# Patient Record
Sex: Male | Born: 1950
Health system: Southern US, Community
[De-identification: ages and names within clinical notes are randomized; demographics above are authoritative.]

## PROBLEM LIST (undated history)

## (undated) DIAGNOSIS — M1990 Unspecified osteoarthritis, unspecified site: Secondary | ICD-10-CM

## (undated) HISTORY — PX: ESOPHAGOGASTRODUODENOSCOPY ENDOSCOPY: SHX5814

## (undated) HISTORY — PX: BLADDER SURGERY: SHX569

## (undated) HISTORY — PX: BACK SURGERY: SHX140

## (undated) HISTORY — PX: OTHER SURGICAL HISTORY: SHX169

---

## 1998-05-09 ENCOUNTER — Ambulatory Visit (HOSPITAL_COMMUNITY): Admission: RE | Admit: 1998-05-09 | Discharge: 1998-05-09 | Payer: Self-pay | Admitting: Family Medicine

## 1998-09-25 ENCOUNTER — Ambulatory Visit (HOSPITAL_COMMUNITY): Admission: RE | Admit: 1998-09-25 | Discharge: 1998-09-25 | Payer: Self-pay | Admitting: Internal Medicine

## 1998-09-25 ENCOUNTER — Encounter: Payer: Self-pay | Admitting: Internal Medicine

## 1998-11-04 ENCOUNTER — Other Ambulatory Visit: Admission: RE | Admit: 1998-11-04 | Discharge: 1998-11-04 | Payer: Self-pay | Admitting: Gastroenterology

## 1998-11-13 ENCOUNTER — Ambulatory Visit (HOSPITAL_COMMUNITY): Admission: RE | Admit: 1998-11-13 | Discharge: 1998-11-13 | Payer: Self-pay | Admitting: Gastroenterology

## 2013-02-08 ENCOUNTER — Other Ambulatory Visit: Payer: Self-pay | Admitting: Gastroenterology

## 2016-03-17 ENCOUNTER — Other Ambulatory Visit: Payer: Self-pay | Admitting: Gastroenterology

## 2016-04-05 ENCOUNTER — Encounter (HOSPITAL_COMMUNITY): Payer: Self-pay | Admitting: *Deleted

## 2016-04-12 ENCOUNTER — Encounter (HOSPITAL_COMMUNITY)
Admission: RE | Disposition: A | Discharge status: Home / Self Care | Payer: Self-pay | Source: Ambulatory Visit | Attending: Gastroenterology

## 2016-04-12 ENCOUNTER — Encounter (HOSPITAL_COMMUNITY): Payer: Self-pay

## 2016-04-12 ENCOUNTER — Ambulatory Visit (HOSPITAL_COMMUNITY): Payer: BLUE CROSS/BLUE SHIELD | Admitting: Anesthesiology

## 2016-04-12 ENCOUNTER — Ambulatory Visit (HOSPITAL_COMMUNITY)
Admission: RE | Admit: 2016-04-12 | Discharge: 2016-04-12 | Disposition: A | Discharge status: Home / Self Care | Payer: BLUE CROSS/BLUE SHIELD | Source: Ambulatory Visit | Attending: Gastroenterology | Admitting: Gastroenterology

## 2016-04-12 DIAGNOSIS — K635 Polyp of colon: Secondary | ICD-10-CM | POA: Diagnosis not present

## 2016-04-12 DIAGNOSIS — Z1211 Encounter for screening for malignant neoplasm of colon: Secondary | ICD-10-CM | POA: Diagnosis present

## 2016-04-12 DIAGNOSIS — D128 Benign neoplasm of rectum: Secondary | ICD-10-CM | POA: Insufficient documentation

## 2016-04-12 DIAGNOSIS — Z86010 Personal history of colonic polyps: Secondary | ICD-10-CM | POA: Diagnosis not present

## 2016-04-12 DIAGNOSIS — Z8551 Personal history of malignant neoplasm of bladder: Secondary | ICD-10-CM | POA: Diagnosis not present

## 2016-04-12 HISTORY — PX: COLONOSCOPY WITH PROPOFOL: SHX5780

## 2016-04-12 HISTORY — DX: Unspecified osteoarthritis, unspecified site: M19.90

## 2016-04-12 SURGERY — COLONOSCOPY WITH PROPOFOL
Anesthesia: Monitor Anesthesia Care

## 2016-04-12 MED ORDER — LACTATED RINGERS IV SOLN
INTRAVENOUS | Status: DC
  Administered 2016-04-12: 1000 mL via INTRAVENOUS

## 2016-04-12 MED ORDER — PROPOFOL 10 MG/ML IV BOLUS
INTRAVENOUS | Status: AC
  Filled 2016-04-12: qty 20

## 2016-04-12 MED ORDER — LIDOCAINE HCL (PF) 2 % IJ SOLN
INTRAMUSCULAR | Status: DC | PRN
  Administered 2016-04-12: 20 mg via INTRADERMAL

## 2016-04-12 MED ORDER — SODIUM CHLORIDE 0.9 % IV SOLN: INTRAVENOUS | Status: DC

## 2016-04-12 MED ORDER — PROPOFOL 10 MG/ML IV BOLUS
INTRAVENOUS | Status: DC | PRN
  Administered 2016-04-12 (×2): 50 mg via INTRAVENOUS
  Administered 2016-04-12: 100 mg via INTRAVENOUS
  Administered 2016-04-12: 50 mg via INTRAVENOUS
  Administered 2016-04-12: 100 mg via INTRAVENOUS

## 2016-04-12 SURGICAL SUPPLY — 21 items

## 2016-04-12 NOTE — Op Note (Signed)
[facility] Patient Name: John Moreno Procedure Date: 04/12/2016 MRN: [id number] Attending MD: Garlan Fair , MD Date of Birth: [birth year] CSN: [id number] Age: 65 Admit Type: Outpatient Procedure:                Colonoscopy Indications:              High risk colon cancer surveillance: Personal                            history of multiple (3 or more) adenomas Providers:                Garlan Fair, MD, Malka So, RN, Alfonso Patten, Technician, Lajuana Carry, CRNA Referring MD:              Medicines:                Propofol per Anesthesia Complications:            No immediate complications. Estimated Blood Loss:     Estimated blood loss: none. Procedure:                Pre-Anesthesia Assessment:                           - Prior to the procedure, a History and Physical                            was performed, and patient medications and                            allergies were reviewed. The patient's tolerance of                            previous anesthesia was also reviewed. The risks                            and benefits of the procedure and the sedation                            options and risks were discussed with the patient.                            All questions were answered, and informed consent                            was obtained. Prior Anticoagulants: The patient has                            taken aspirin, last dose was 4 days prior to                            procedure. ASA Grade Assessment: II - A patient  with mild systemic disease. After reviewing the                            risks and benefits, the patient was deemed in                            satisfactory condition to undergo the procedure.                           After obtaining informed consent, the colonoscope                            was passed under direct vision. Throughout the   procedure, the patient's blood pressure, pulse, and                            oxygen saturations were monitored continuously. The                            Colonoscope was introduced through the anus and                            advanced to the the cecum, identified by                            appendiceal orifice and ileocecal valve. The                            colonoscopy was performed without difficulty. The                            patient tolerated the procedure well. The quality                            of the bowel preparation was good. The terminal                            ileum, the ileocecal valve, the appendiceal orifice                            and the rectum were photographed. Scope In: 11:16:58 AM Scope Out: 11:39:57 AM Scope Withdrawal Time: 0 hours 14 minutes 38 seconds  Total Procedure Duration: 0 hours 22 minutes 59 seconds  Findings:      The perianal and digital rectal examinations were normal.      A 5 mm polyp was found in the rectum. The polyp was sessile. The polyp       was removed with a cold snare. Resection and retrieval were complete.      Two sessile polyps were found in the rectum. The polyps were 3 mm in       size. These polyps were removed with a cold biopsy forceps. Resection       and retrieval were complete.      A 2 mm polyp was found in the distal transverse colon. The polyp was  sessile. The polyp was removed with a cold biopsy forceps. Resection and       retrieval were complete.      The exam was otherwise without abnormality. Impression:               - One 5 mm polyp in the rectum, removed with a cold                            snare. Resected and retrieved.                           - Two 3 mm polyps in the rectum, removed with a                            cold biopsy forceps. Resected and retrieved.                           - One 2 mm polyp in the distal transverse colon,                            removed with a cold  biopsy forceps. Resected and                            retrieved.                           - The examination was otherwise normal. Moderate Sedation:      N/A- Per Anesthesia Care Recommendation:           - Patient has a contact number available for                            emergencies. The signs and symptoms of potential                            delayed complications were discussed with the                            patient. Return to normal activities tomorrow.                            Written discharge instructions were provided to the                            patient.                           - Repeat colonoscopy in 5 years for surveillance.                           - Resume previous diet.                           - Continue present medications. Procedure Code(s):        --- Professional ---  45385, Colonoscopy, flexible; with removal of                            tumor(s), polyp(s), or other lesion(s) by snare                            technique                           45380, 59, Colonoscopy, flexible; with biopsy,                            single or multiple Diagnosis Code(s):        --- Professional ---                           Z86.010, Personal history of colonic polyps                           K62.1, Rectal polyp                           D12.3, Benign neoplasm of transverse colon (hepatic                            flexure or splenic flexure) CPT copyright 2016 [employer]. All rights reserved. The codes documented in this report are preliminary and upon coder review may  be revised to meet current compliance requirements. Earle Gell, MD Garlan Fair, MD 04/12/2016 11:49:31 AM This report has been signed electronically. Number of Addenda: 0

## 2016-04-12 NOTE — Discharge Instructions (Signed)

## 2016-04-12 NOTE — Anesthesia Preprocedure Evaluation (Signed)
Anesthesia Evaluation  Patient identified by MRN, date of birth, ID band Patient awake    Reviewed: Allergy & Precautions, NPO status , Patient's Chart, lab work & pertinent test results  Airway Mallampati: I  TM Distance: >3 FB Neck ROM: Full    Dental   Pulmonary    Pulmonary exam normal        Cardiovascular Normal cardiovascular exam     Neuro/Psych    GI/Hepatic   Endo/Other    Renal/GU      Musculoskeletal   Abdominal   Peds  Hematology   Anesthesia Other Findings   Reproductive/Obstetrics                             Anesthesia Physical Anesthesia Plan  ASA: II  Anesthesia Plan: MAC   Post-op Pain Management:    Induction: Intravenous  Airway Management Planned: Natural Airway  Additional Equipment:   Intra-op Plan:   Post-operative Plan:   Informed Consent: I have reviewed the patients History and Physical, chart, labs and discussed the procedure including the risks, benefits and alternatives for the proposed anesthesia with the patient or authorized representative who has indicated his/her understanding and acceptance.     Plan Discussed with: CRNA and Surgeon  Anesthesia Plan Comments:         Anesthesia Quick Evaluation

## 2016-04-12 NOTE — Transfer of Care (Signed)
Immediate Anesthesia Transfer of Care Note  Patient: John Moreno  Procedure(s) Performed: Procedure(s): COLONOSCOPY WITH PROPOFOL (N/A)  Patient Location: PACU  Anesthesia Type:MAC  Level of Consciousness:  sedated, patient cooperative and responds to stimulation  Airway & Oxygen Therapy:Patient Spontanous Breathing and Patient connected to face mask oxgen  Post-op Assessment:  Report given to PACU RN and Post -op Vital signs reviewed and stable  Post vital signs:  Reviewed and stable  Last Vitals:  Filed Vitals:   04/12/16 0920  BP: 130/77  Pulse: 73  Temp: 36.6 C  Resp: 21    Complications: No apparent anesthesia complications

## 2016-04-12 NOTE — H&P (Signed)
  Procedure: Surveillance colonoscopy. 02/08/2013 colonoscopy was performed with removal of five small adenomatous and sessile serrated adenomatous colon polyps  History: The patient is a 65 year old male born 05-22-1951. He is scheduled to undergo a surveillance colonoscopy today.  Past medical history: Back surgery performed in 1986. Bladder surgery performed in 2001. Hypercholesterolemia. Bladder cancer. Benign prostatic hypertrophy.  Medication allergies: Statin drugs cause myalgias  Exam: The patient is alert and lying comfortably on the endoscopy stretcher. Abdomen is soft and nontender to palpation. Cardiac exam reveals a regular rhythm. Lungs are clear to auscultation.  Plan: Proceed with surveillance colonoscopy

## 2016-04-12 NOTE — Anesthesia Postprocedure Evaluation (Signed)
Anesthesia Post Note  Patient: John Moreno  Procedure(s) Performed: Procedure(s) (LRB): COLONOSCOPY WITH PROPOFOL (N/A)  Patient location during evaluation: PACU Anesthesia Type: MAC Level of consciousness: awake and alert Pain management: pain level controlled Vital Signs Assessment: post-procedure vital signs reviewed and stable Respiratory status: spontaneous breathing, nonlabored ventilation, respiratory function stable and patient connected to nasal cannula oxygen Cardiovascular status: stable and blood pressure returned to baseline Anesthetic complications: no    Last Vitals:  Filed Vitals:   04/12/16 1206 04/12/16 1218  BP: 99/77 108/79  Pulse: 50 53  Temp:    Resp: 13 16    Last Pain: There were no vitals filed for this visit.               [place]

## 2016-04-13 ENCOUNTER — Encounter (HOSPITAL_COMMUNITY): Payer: Self-pay | Admitting: Gastroenterology

## 2017-04-25 DIAGNOSIS — N5201 Erectile dysfunction due to arterial insufficiency: Secondary | ICD-10-CM | POA: Diagnosis not present

## 2017-04-25 DIAGNOSIS — Z8551 Personal history of malignant neoplasm of bladder: Secondary | ICD-10-CM | POA: Diagnosis not present

## 2017-04-25 DIAGNOSIS — R351 Nocturia: Secondary | ICD-10-CM | POA: Diagnosis not present

## 2017-04-25 DIAGNOSIS — N401 Enlarged prostate with lower urinary tract symptoms: Secondary | ICD-10-CM | POA: Diagnosis not present

## 2017-05-11 DIAGNOSIS — E7800 Pure hypercholesterolemia, unspecified: Secondary | ICD-10-CM | POA: Diagnosis not present

## 2017-05-11 DIAGNOSIS — Z0000 Encounter for general adult medical examination without abnormal findings: Secondary | ICD-10-CM | POA: Diagnosis not present

## 2017-05-11 DIAGNOSIS — Z1389 Encounter for screening for other disorder: Secondary | ICD-10-CM | POA: Diagnosis not present

## 2017-05-11 DIAGNOSIS — Z23 Encounter for immunization: Secondary | ICD-10-CM | POA: Diagnosis not present

## 2017-09-15 DIAGNOSIS — Z23 Encounter for immunization: Secondary | ICD-10-CM | POA: Diagnosis not present

## 2018-05-15 DIAGNOSIS — Z8551 Personal history of malignant neoplasm of bladder: Secondary | ICD-10-CM | POA: Diagnosis not present

## 2018-05-15 DIAGNOSIS — Z0000 Encounter for general adult medical examination without abnormal findings: Secondary | ICD-10-CM | POA: Diagnosis not present

## 2018-05-15 DIAGNOSIS — Z6828 Body mass index (BMI) 28.0-28.9, adult: Secondary | ICD-10-CM | POA: Diagnosis not present

## 2018-05-15 DIAGNOSIS — E669 Obesity, unspecified: Secondary | ICD-10-CM | POA: Diagnosis not present

## 2018-05-15 DIAGNOSIS — E7800 Pure hypercholesterolemia, unspecified: Secondary | ICD-10-CM | POA: Diagnosis not present

## 2018-05-15 DIAGNOSIS — Z1389 Encounter for screening for other disorder: Secondary | ICD-10-CM | POA: Diagnosis not present

## 2018-05-15 DIAGNOSIS — G720 Drug-induced myopathy: Secondary | ICD-10-CM | POA: Diagnosis not present

## 2018-10-09 DIAGNOSIS — Z23 Encounter for immunization: Secondary | ICD-10-CM | POA: Diagnosis not present

## 2018-10-12 DIAGNOSIS — H10413 Chronic giant papillary conjunctivitis, bilateral: Secondary | ICD-10-CM | POA: Diagnosis not present

## 2018-10-12 DIAGNOSIS — H2513 Age-related nuclear cataract, bilateral: Secondary | ICD-10-CM | POA: Diagnosis not present

## 2018-10-12 DIAGNOSIS — H43393 Other vitreous opacities, bilateral: Secondary | ICD-10-CM | POA: Diagnosis not present

## 2018-10-12 DIAGNOSIS — H40013 Open angle with borderline findings, low risk, bilateral: Secondary | ICD-10-CM | POA: Diagnosis not present

## 2018-10-12 DIAGNOSIS — H04123 Dry eye syndrome of bilateral lacrimal glands: Secondary | ICD-10-CM | POA: Diagnosis not present

## 2018-12-12 DIAGNOSIS — J0100 Acute maxillary sinusitis, unspecified: Secondary | ICD-10-CM | POA: Diagnosis not present

## 2019-05-07 DIAGNOSIS — R351 Nocturia: Secondary | ICD-10-CM | POA: Diagnosis not present

## 2019-05-07 DIAGNOSIS — N401 Enlarged prostate with lower urinary tract symptoms: Secondary | ICD-10-CM | POA: Diagnosis not present

## 2019-05-07 DIAGNOSIS — N5201 Erectile dysfunction due to arterial insufficiency: Secondary | ICD-10-CM | POA: Diagnosis not present

## 2019-05-07 DIAGNOSIS — Z8551 Personal history of malignant neoplasm of bladder: Secondary | ICD-10-CM | POA: Diagnosis not present

## 2019-05-23 DIAGNOSIS — Z1389 Encounter for screening for other disorder: Secondary | ICD-10-CM | POA: Diagnosis not present

## 2019-05-23 DIAGNOSIS — Z0000 Encounter for general adult medical examination without abnormal findings: Secondary | ICD-10-CM | POA: Diagnosis not present

## 2019-08-16 DIAGNOSIS — Z23 Encounter for immunization: Secondary | ICD-10-CM | POA: Diagnosis not present

## 2019-08-16 DIAGNOSIS — M545 Low back pain: Secondary | ICD-10-CM | POA: Diagnosis not present

## 2019-08-31 ENCOUNTER — Other Ambulatory Visit: Payer: Self-pay | Admitting: Internal Medicine

## 2019-08-31 DIAGNOSIS — M5442 Lumbago with sciatica, left side: Secondary | ICD-10-CM

## 2019-09-02 ENCOUNTER — Ambulatory Visit
Admission: RE | Admit: 2019-09-02 | Discharge: 2019-09-02 | Disposition: A | Discharge status: Home / Self Care | Payer: Self-pay | Source: Ambulatory Visit | Attending: Internal Medicine | Admitting: Internal Medicine

## 2019-09-02 ENCOUNTER — Other Ambulatory Visit: Payer: Self-pay

## 2019-09-02 DIAGNOSIS — M48061 Spinal stenosis, lumbar region without neurogenic claudication: Secondary | ICD-10-CM | POA: Diagnosis not present

## 2019-09-02 DIAGNOSIS — M5442 Lumbago with sciatica, left side: Secondary | ICD-10-CM

## 2019-09-07 DIAGNOSIS — M5416 Radiculopathy, lumbar region: Secondary | ICD-10-CM | POA: Diagnosis not present

## 2020-11-18 DIAGNOSIS — J029 Acute pharyngitis, unspecified: Secondary | ICD-10-CM | POA: Diagnosis not present

## 2020-11-21 ENCOUNTER — Ambulatory Visit: Payer: Medicare Other | Attending: Internal Medicine

## 2020-11-21 DIAGNOSIS — Z23 Encounter for immunization: Secondary | ICD-10-CM

## 2020-11-21 NOTE — Progress Notes (Signed)
   Covid-19 Vaccination Clinic  Name:  John Moreno    MRN: [id number] DOB: Dec 18, 1950  11/21/2020  [name] was observed post Covid-19 immunization for 15 minutes without incident. He was provided with Vaccine Information Sheet and instruction to access the V-Safe system.   [name] was instructed to call 911 with any severe reactions post vaccine: Marland Kitchen Difficulty breathing  . Swelling of face and throat  . A fast heartbeat  . A bad rash all over body  . Dizziness and weakness   Immunizations Administered    Name Date Dose VIS Date Route   Pfizer COVID-19 Vaccine 11/21/2020  2:09 PM 0.3 mL 09/24/2020 Intramuscular   Manufacturer: [place]   Lot: [id number]   [place]: [id number]

## 2020-12-01 DIAGNOSIS — H04123 Dry eye syndrome of bilateral lacrimal glands: Secondary | ICD-10-CM | POA: Diagnosis not present

## 2020-12-01 DIAGNOSIS — H43813 Vitreous degeneration, bilateral: Secondary | ICD-10-CM | POA: Diagnosis not present

## 2020-12-01 DIAGNOSIS — H10413 Chronic giant papillary conjunctivitis, bilateral: Secondary | ICD-10-CM | POA: Diagnosis not present

## 2020-12-01 DIAGNOSIS — H40013 Open angle with borderline findings, low risk, bilateral: Secondary | ICD-10-CM | POA: Diagnosis not present

## 2020-12-01 DIAGNOSIS — H2513 Age-related nuclear cataract, bilateral: Secondary | ICD-10-CM | POA: Diagnosis not present

## 2021-01-01 DIAGNOSIS — H2511 Age-related nuclear cataract, right eye: Secondary | ICD-10-CM | POA: Diagnosis not present

## 2021-03-26 IMAGING — MR MR LUMBAR SPINE W/O CM
5 series · 46 of 48 positions shown · non-contrast
Comparison: None.

CLINICAL DATA: Acute bilateral back pain and left radicular pain.

EXAM:
MRI LUMBAR SPINE WITHOUT CONTRAST
TECHNIQUE: Multiplanar, multisequence MR imaging of the lumbar spine was
performed. No intravenous contrast was administered.

[Series 3: T2 post-contrast · sagittal · 4.0mm · 0.88mm/px · 6 of 12 slices shown]
[im 1/12]
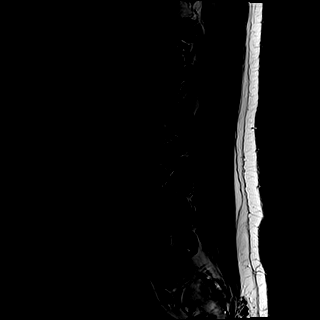
[im 3/12]
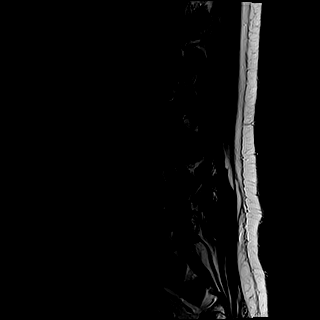
[im 5/12]
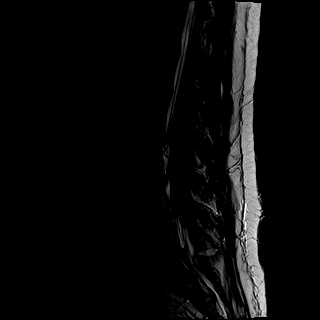
[im 7/12]
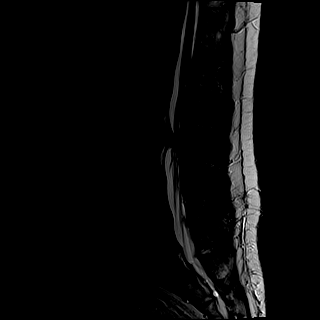
[im 9/12]
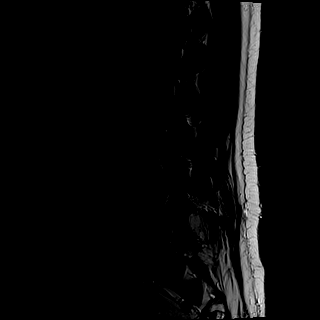
[im 12/12]
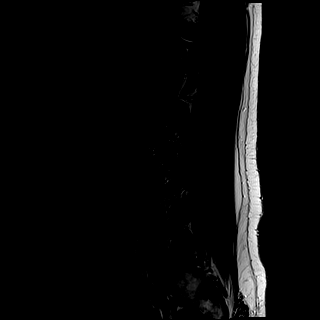

[Series 4: T1 · sagittal · 4.0mm · 0.88mm/px · 5 of 12 slices shown (1 of 2)]
[im 1/12]
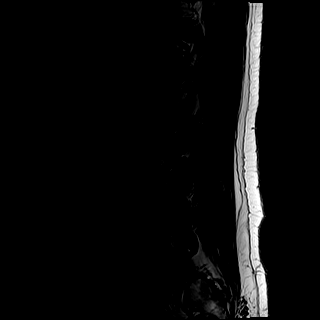
[im 3/12]
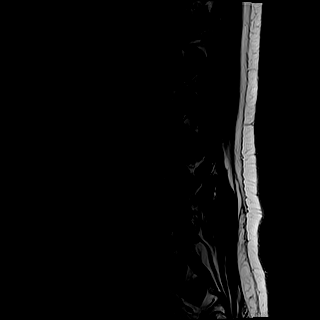
[im 6/12]
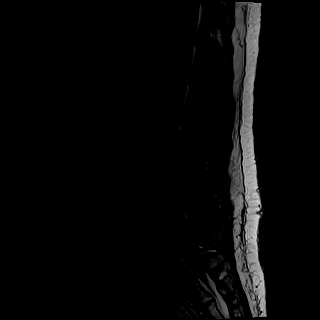
[im 9/12]
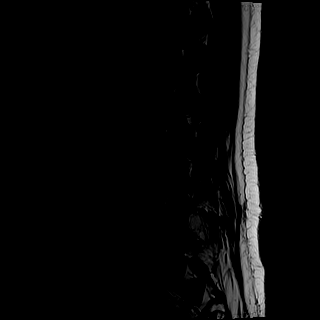
[im 12/12]
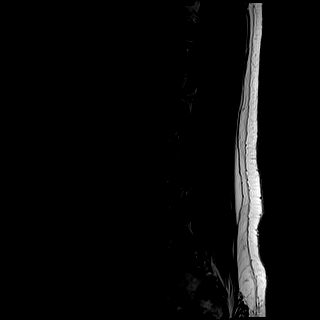

[Series 5: tirm sag · sagittal · 4.0mm · 0.55mm/px · 5 of 12 slices shown]
[im 1/12]
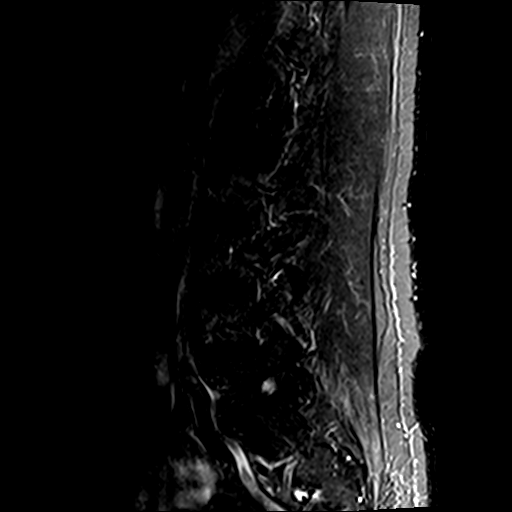
[im 3/12]
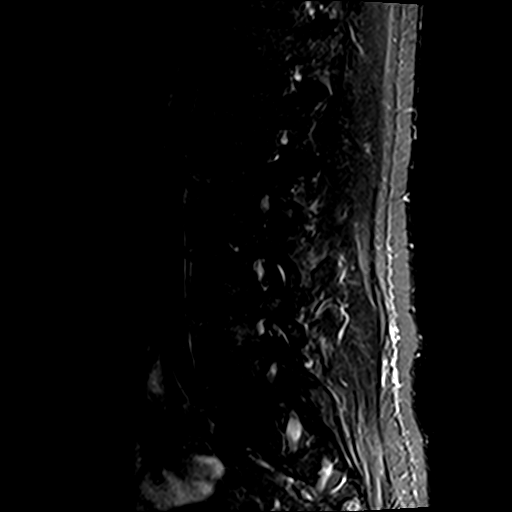
[im 6/12]
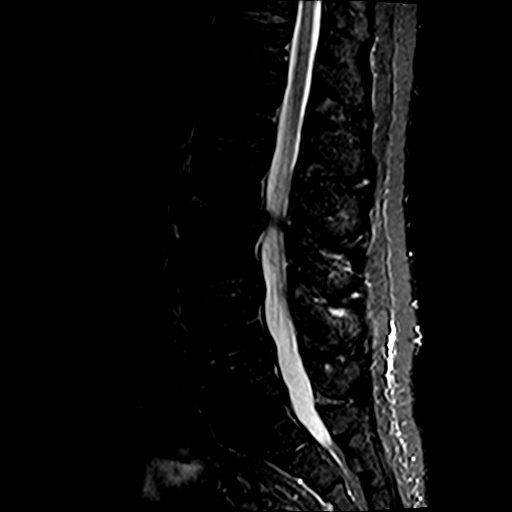
[im 9/12]
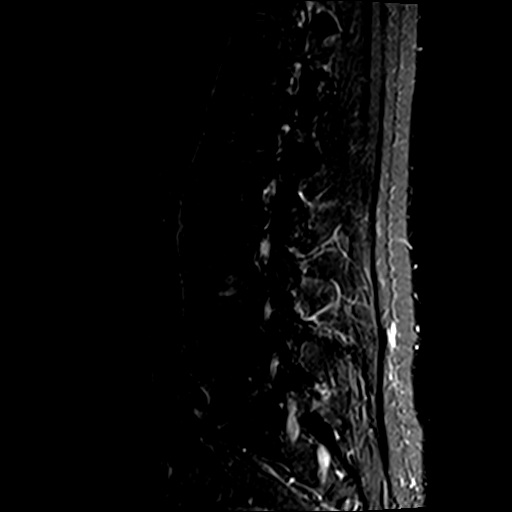
[im 12/12]
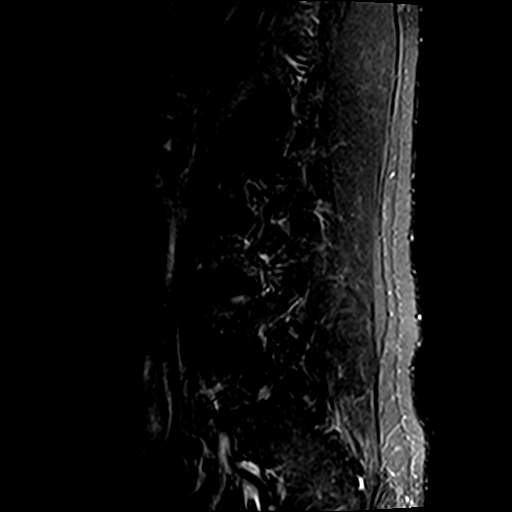

[Series 6: T1 · axial · 4.0mm · 0.78mm/px · z∈[-120,+107]mm · 14 of 39 slices shown (2 of 2)]
[im 1/39]
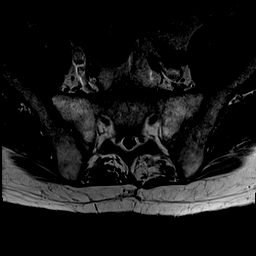
[im 3/39]
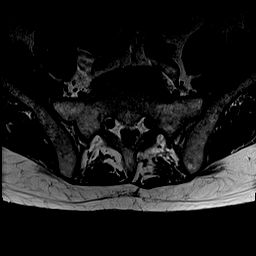
[im 6/39]
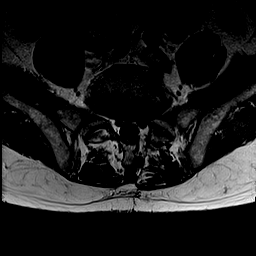
[im 8/39]
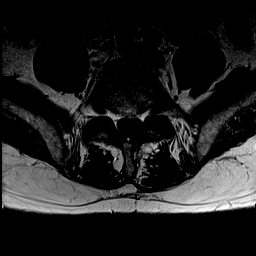
[im 11/39]
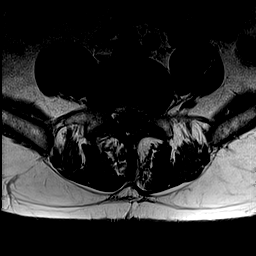
[im 13/39]
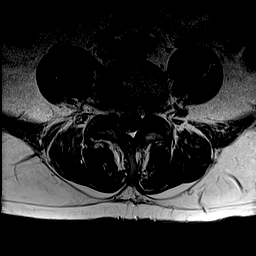
[im 16/39]
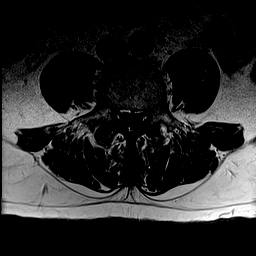
[im 18/39]
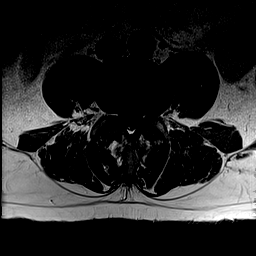
[im 21/39]
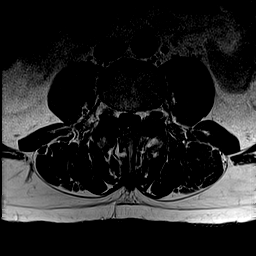
[im 23/39]
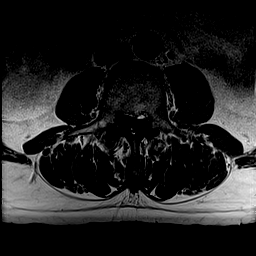
[im 26/39]
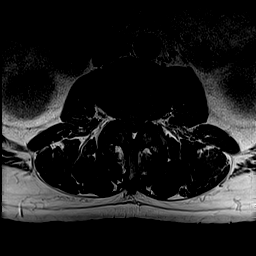
[im 28/39]
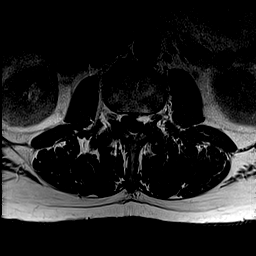
[im 33/39]
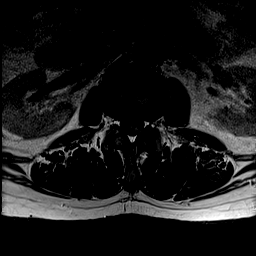
[im 39/39]
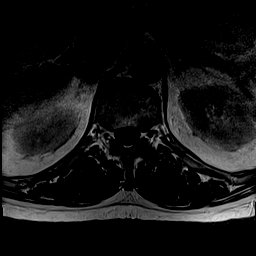

[Series 7: T2 · axial · 4.0mm · 0.78mm/px · z∈[-120,+107]mm · 16 of 39 slices shown]
[im 1/39]
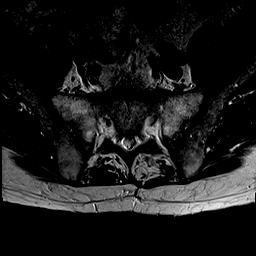
[im 3/39]
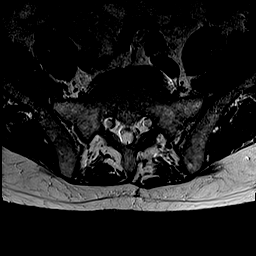
[im 6/39]
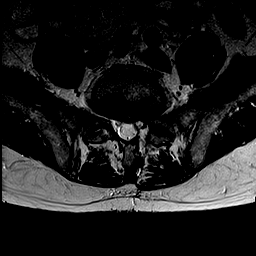
[im 8/39]
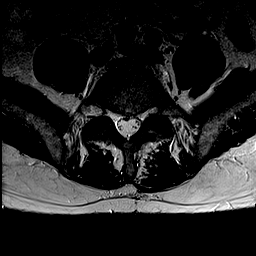
[im 11/39]
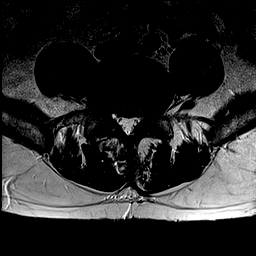
[im 13/39]
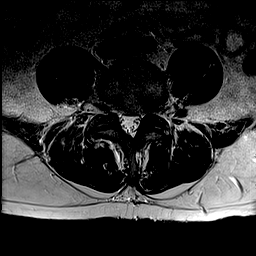
[im 16/39]
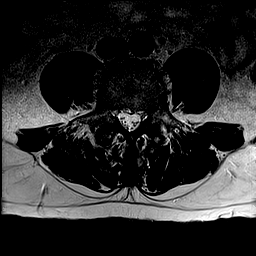
[im 18/39]
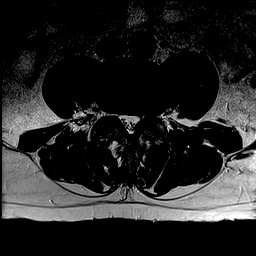
[im 21/39]
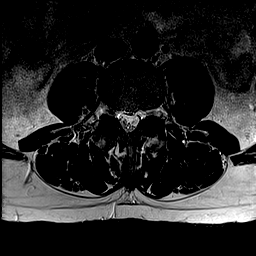
[im 23/39]
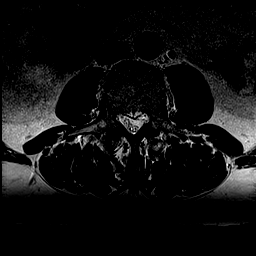
[im 26/39]
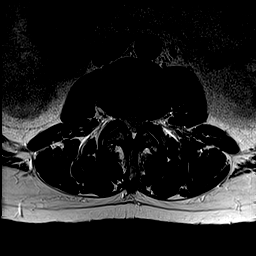
[im 28/39]
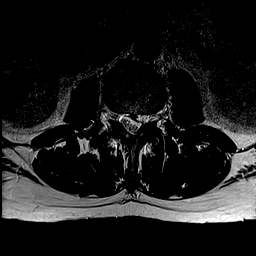
[im 31/39]
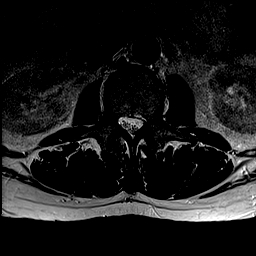
[im 33/39]
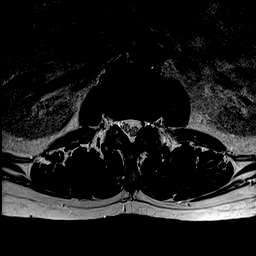
[im 36/39]
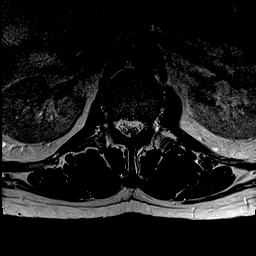
[im 39/39]
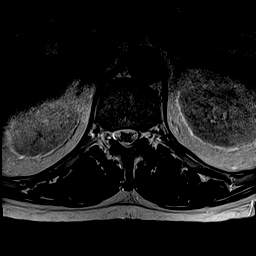

[46 of 48 positions shown; findings below may reference images not displayed]

FINDINGS: Segmentation:  5 lumbar type vertebral bodies.

Alignment:  Normal

Vertebrae: No fracture or primary bone lesion. Some discogenic edema
within the inferior endplate at L4.

Conus medullaris and cauda equina: Conus extends to the L1 level.

Paraspinal and other soft tissues: Normal

Disc levels:

No significant finding at L1-2 or above.

L2-3: Large central to left-sided disc herniation. Facet and
ligamentous hypertrophy. Severe spinal stenosis at this level, worse
on the left than the right, likely to cause neural compression.

L3-4: Bulging of the disc slightly more towards the left. Facet and
ligamentous hypertrophy. Mild narrowing of the left lateral recess
and intervertebral foramen on the left without definite neural
compression.

L4-5: Bulging of the disc. Mild facet hypertrophy. Mild stenosis of
both lateral recesses and foramina without visible neural
compression.

L5-S1: Previous left hemilaminectomy and discectomy. Loss of disc
height. Endplate osteophytes without herniated disc material. Mild
facet hypertrophy. No compressive stenosis.
IMPRESSION: Large disc herniation at L2-3 centrally and to the left with severe
spinal stenosis. Neural compression is likely at this level,
particularly on the left.

L3-4: Disc bulge more towards the left. Facet hypertrophy. Mild
stenosis of the left lateral recess and intervertebral foramen on
the left but without visible neural compression.

L4-5: Endplate osteophytes and disc bulge. Mild facet hypertrophy.
Mild stenosis of both lateral recesses but without likely neural
compression.

L5-S1: Distant left hemilaminectomy and discectomy. No compressive
stenosis at this level.

## 2021-05-08 DIAGNOSIS — J029 Acute pharyngitis, unspecified: Secondary | ICD-10-CM | POA: Diagnosis not present

## 2021-06-03 DIAGNOSIS — L814 Other melanin hyperpigmentation: Secondary | ICD-10-CM | POA: Diagnosis not present

## 2021-06-03 DIAGNOSIS — L820 Inflamed seborrheic keratosis: Secondary | ICD-10-CM | POA: Diagnosis not present

## 2021-06-03 DIAGNOSIS — L570 Actinic keratosis: Secondary | ICD-10-CM | POA: Diagnosis not present

## 2021-06-03 DIAGNOSIS — L821 Other seborrheic keratosis: Secondary | ICD-10-CM | POA: Diagnosis not present

## 2021-06-03 DIAGNOSIS — D1801 Hemangioma of skin and subcutaneous tissue: Secondary | ICD-10-CM | POA: Diagnosis not present

## 2021-06-09 DIAGNOSIS — Z1389 Encounter for screening for other disorder: Secondary | ICD-10-CM | POA: Diagnosis not present

## 2021-06-09 DIAGNOSIS — H10413 Chronic giant papillary conjunctivitis, bilateral: Secondary | ICD-10-CM | POA: Diagnosis not present

## 2021-06-09 DIAGNOSIS — H04123 Dry eye syndrome of bilateral lacrimal glands: Secondary | ICD-10-CM | POA: Diagnosis not present

## 2021-06-09 DIAGNOSIS — Z86010 Personal history of colonic polyps: Secondary | ICD-10-CM | POA: Diagnosis not present

## 2021-06-09 DIAGNOSIS — Z961 Presence of intraocular lens: Secondary | ICD-10-CM | POA: Diagnosis not present

## 2021-06-09 DIAGNOSIS — N400 Enlarged prostate without lower urinary tract symptoms: Secondary | ICD-10-CM | POA: Diagnosis not present

## 2021-06-09 DIAGNOSIS — G720 Drug-induced myopathy: Secondary | ICD-10-CM | POA: Diagnosis not present

## 2021-06-09 DIAGNOSIS — Z8551 Personal history of malignant neoplasm of bladder: Secondary | ICD-10-CM | POA: Diagnosis not present

## 2021-06-09 DIAGNOSIS — Z0000 Encounter for general adult medical examination without abnormal findings: Secondary | ICD-10-CM | POA: Diagnosis not present

## 2021-06-09 DIAGNOSIS — H2512 Age-related nuclear cataract, left eye: Secondary | ICD-10-CM | POA: Diagnosis not present

## 2021-06-09 DIAGNOSIS — Z125 Encounter for screening for malignant neoplasm of prostate: Secondary | ICD-10-CM | POA: Diagnosis not present

## 2021-06-09 DIAGNOSIS — H40013 Open angle with borderline findings, low risk, bilateral: Secondary | ICD-10-CM | POA: Diagnosis not present

## 2021-06-09 DIAGNOSIS — R351 Nocturia: Secondary | ICD-10-CM | POA: Diagnosis not present

## 2021-06-09 DIAGNOSIS — K644 Residual hemorrhoidal skin tags: Secondary | ICD-10-CM | POA: Diagnosis not present

## 2021-06-09 DIAGNOSIS — Z1159 Encounter for screening for other viral diseases: Secondary | ICD-10-CM | POA: Diagnosis not present

## 2021-06-09 DIAGNOSIS — R8290 Unspecified abnormal findings in urine: Secondary | ICD-10-CM | POA: Diagnosis not present

## 2021-06-09 DIAGNOSIS — E7800 Pure hypercholesterolemia, unspecified: Secondary | ICD-10-CM | POA: Diagnosis not present

## 2021-06-09 DIAGNOSIS — H43813 Vitreous degeneration, bilateral: Secondary | ICD-10-CM | POA: Diagnosis not present

## 2021-08-05 ENCOUNTER — Other Ambulatory Visit: Payer: Self-pay

## 2021-08-05 ENCOUNTER — Encounter (INDEPENDENT_AMBULATORY_CARE_PROVIDER_SITE_OTHER): Payer: Medicare Other | Admitting: Ophthalmology

## 2021-08-05 DIAGNOSIS — H04123 Dry eye syndrome of bilateral lacrimal glands: Secondary | ICD-10-CM | POA: Diagnosis not present

## 2021-08-05 DIAGNOSIS — H40013 Open angle with borderline findings, low risk, bilateral: Secondary | ICD-10-CM | POA: Diagnosis not present

## 2021-08-05 DIAGNOSIS — H2512 Age-related nuclear cataract, left eye: Secondary | ICD-10-CM

## 2021-08-05 DIAGNOSIS — H43813 Vitreous degeneration, bilateral: Secondary | ICD-10-CM | POA: Diagnosis not present

## 2021-08-05 DIAGNOSIS — Z961 Presence of intraocular lens: Secondary | ICD-10-CM | POA: Diagnosis not present

## 2021-08-05 DIAGNOSIS — H0102B Squamous blepharitis left eye, upper and lower eyelids: Secondary | ICD-10-CM | POA: Diagnosis not present

## 2021-08-05 DIAGNOSIS — H0102A Squamous blepharitis right eye, upper and lower eyelids: Secondary | ICD-10-CM | POA: Diagnosis not present

## 2021-08-05 DIAGNOSIS — H33311 Horseshoe tear of retina without detachment, right eye: Secondary | ICD-10-CM | POA: Diagnosis not present

## 2021-08-05 DIAGNOSIS — H33011 Retinal detachment with single break, right eye: Secondary | ICD-10-CM

## 2021-08-05 DIAGNOSIS — H10413 Chronic giant papillary conjunctivitis, bilateral: Secondary | ICD-10-CM | POA: Diagnosis not present

## 2021-08-19 ENCOUNTER — Encounter (INDEPENDENT_AMBULATORY_CARE_PROVIDER_SITE_OTHER): Payer: Medicare Other | Admitting: Ophthalmology

## 2021-08-19 ENCOUNTER — Other Ambulatory Visit: Payer: Self-pay

## 2021-08-19 DIAGNOSIS — H33301 Unspecified retinal break, right eye: Secondary | ICD-10-CM

## 2021-09-30 DIAGNOSIS — Z23 Encounter for immunization: Secondary | ICD-10-CM | POA: Diagnosis not present

## 2021-12-09 DIAGNOSIS — H33311 Horseshoe tear of retina without detachment, right eye: Secondary | ICD-10-CM | POA: Diagnosis not present

## 2021-12-09 DIAGNOSIS — H0102A Squamous blepharitis right eye, upper and lower eyelids: Secondary | ICD-10-CM | POA: Diagnosis not present

## 2021-12-09 DIAGNOSIS — H0102B Squamous blepharitis left eye, upper and lower eyelids: Secondary | ICD-10-CM | POA: Diagnosis not present

## 2021-12-09 DIAGNOSIS — H10021 Other mucopurulent conjunctivitis, right eye: Secondary | ICD-10-CM | POA: Diagnosis not present

## 2021-12-09 DIAGNOSIS — H40013 Open angle with borderline findings, low risk, bilateral: Secondary | ICD-10-CM | POA: Diagnosis not present

## 2021-12-09 DIAGNOSIS — Z961 Presence of intraocular lens: Secondary | ICD-10-CM | POA: Diagnosis not present

## 2021-12-09 DIAGNOSIS — H43813 Vitreous degeneration, bilateral: Secondary | ICD-10-CM | POA: Diagnosis not present

## 2021-12-09 DIAGNOSIS — H2512 Age-related nuclear cataract, left eye: Secondary | ICD-10-CM | POA: Diagnosis not present

## 2021-12-09 DIAGNOSIS — H10413 Chronic giant papillary conjunctivitis, bilateral: Secondary | ICD-10-CM | POA: Diagnosis not present

## 2021-12-09 DIAGNOSIS — H04123 Dry eye syndrome of bilateral lacrimal glands: Secondary | ICD-10-CM | POA: Diagnosis not present

## 2021-12-11 DIAGNOSIS — H66001 Acute suppurative otitis media without spontaneous rupture of ear drum, right ear: Secondary | ICD-10-CM | POA: Diagnosis not present

## 2021-12-11 DIAGNOSIS — R202 Paresthesia of skin: Secondary | ICD-10-CM | POA: Diagnosis not present

## 2021-12-17 DIAGNOSIS — K648 Other hemorrhoids: Secondary | ICD-10-CM | POA: Diagnosis not present

## 2021-12-17 DIAGNOSIS — D128 Benign neoplasm of rectum: Secondary | ICD-10-CM | POA: Diagnosis not present

## 2021-12-17 DIAGNOSIS — Z86010 Personal history of colonic polyps: Secondary | ICD-10-CM | POA: Diagnosis not present

## 2021-12-21 ENCOUNTER — Encounter (INDEPENDENT_AMBULATORY_CARE_PROVIDER_SITE_OTHER): Payer: Medicare Other | Admitting: Ophthalmology

## 2021-12-21 ENCOUNTER — Other Ambulatory Visit: Payer: Self-pay

## 2021-12-21 DIAGNOSIS — H2701 Aphakia, right eye: Secondary | ICD-10-CM | POA: Diagnosis not present

## 2021-12-21 DIAGNOSIS — H2512 Age-related nuclear cataract, left eye: Secondary | ICD-10-CM

## 2021-12-21 DIAGNOSIS — H43813 Vitreous degeneration, bilateral: Secondary | ICD-10-CM

## 2021-12-21 DIAGNOSIS — H33301 Unspecified retinal break, right eye: Secondary | ICD-10-CM | POA: Diagnosis not present

## 2021-12-22 DIAGNOSIS — D128 Benign neoplasm of rectum: Secondary | ICD-10-CM | POA: Diagnosis not present

## 2022-06-09 DIAGNOSIS — L820 Inflamed seborrheic keratosis: Secondary | ICD-10-CM | POA: Diagnosis not present

## 2022-06-09 DIAGNOSIS — D1801 Hemangioma of skin and subcutaneous tissue: Secondary | ICD-10-CM | POA: Diagnosis not present

## 2022-06-09 DIAGNOSIS — L821 Other seborrheic keratosis: Secondary | ICD-10-CM | POA: Diagnosis not present

## 2022-06-09 DIAGNOSIS — D225 Melanocytic nevi of trunk: Secondary | ICD-10-CM | POA: Diagnosis not present

## 2022-06-09 DIAGNOSIS — L812 Freckles: Secondary | ICD-10-CM | POA: Diagnosis not present

## 2022-06-10 DIAGNOSIS — G720 Drug-induced myopathy: Secondary | ICD-10-CM | POA: Diagnosis not present

## 2022-06-10 DIAGNOSIS — E7800 Pure hypercholesterolemia, unspecified: Secondary | ICD-10-CM | POA: Diagnosis not present

## 2022-06-10 DIAGNOSIS — Z0000 Encounter for general adult medical examination without abnormal findings: Secondary | ICD-10-CM | POA: Diagnosis not present

## 2022-06-10 DIAGNOSIS — Z8551 Personal history of malignant neoplasm of bladder: Secondary | ICD-10-CM | POA: Diagnosis not present

## 2022-06-10 DIAGNOSIS — N400 Enlarged prostate without lower urinary tract symptoms: Secondary | ICD-10-CM | POA: Diagnosis not present

## 2022-06-10 DIAGNOSIS — Z86010 Personal history of colonic polyps: Secondary | ICD-10-CM | POA: Diagnosis not present

## 2022-06-10 DIAGNOSIS — Z1331 Encounter for screening for depression: Secondary | ICD-10-CM | POA: Diagnosis not present

## 2022-06-25 ENCOUNTER — Telehealth: Payer: Self-pay | Admitting: *Deleted

## 2022-06-25 NOTE — Chronic Care Management (AMB) (Signed)
  Care Coordination  Note  06/25/2022 Name: SISTO GRANILLO MRN: [id number] DOB: 22-Oct-1951  Aylan HARLIE RAGLE is a 71 y.o. year old male who is a primary care patient of Lavone Orn, MD. I reached out to Jamse Belfast by phone today to offer care coordination services.      Mr. Varricchio was given information about Care Coordination services today including:  The Care Coordination services include support from the care team which includes your Nurse Coordinator, Clinical Social Worker, or Pharmacist.  The Care Coordination team is here to help remove barriers to the health concerns and goals most important to you. Care Coordination services are voluntary and the patient may decline or stop services at any time by request to their care team member.   Patient did not agree to participate in care coordination services at this time.  Follow up plan: Patient declines further follow up or participation in care coordination services.   Julian Hy, [place] Direct Dial: [phone]

## 2022-08-06 DIAGNOSIS — H40013 Open angle with borderline findings, low risk, bilateral: Secondary | ICD-10-CM | POA: Diagnosis not present

## 2022-08-06 DIAGNOSIS — H0102B Squamous blepharitis left eye, upper and lower eyelids: Secondary | ICD-10-CM | POA: Diagnosis not present

## 2022-08-06 DIAGNOSIS — Z961 Presence of intraocular lens: Secondary | ICD-10-CM | POA: Diagnosis not present

## 2022-08-06 DIAGNOSIS — H0102A Squamous blepharitis right eye, upper and lower eyelids: Secondary | ICD-10-CM | POA: Diagnosis not present

## 2022-08-06 DIAGNOSIS — H43813 Vitreous degeneration, bilateral: Secondary | ICD-10-CM | POA: Diagnosis not present

## 2022-08-06 DIAGNOSIS — H2512 Age-related nuclear cataract, left eye: Secondary | ICD-10-CM | POA: Diagnosis not present

## 2022-08-06 DIAGNOSIS — H33311 Horseshoe tear of retina without detachment, right eye: Secondary | ICD-10-CM | POA: Diagnosis not present

## 2022-08-06 DIAGNOSIS — H10413 Chronic giant papillary conjunctivitis, bilateral: Secondary | ICD-10-CM | POA: Diagnosis not present

## 2022-08-06 DIAGNOSIS — H04123 Dry eye syndrome of bilateral lacrimal glands: Secondary | ICD-10-CM | POA: Diagnosis not present

## 2022-12-14 DIAGNOSIS — M1711 Unilateral primary osteoarthritis, right knee: Secondary | ICD-10-CM | POA: Diagnosis not present

## 2022-12-14 DIAGNOSIS — M25561 Pain in right knee: Secondary | ICD-10-CM | POA: Diagnosis not present

## 2023-02-21 DIAGNOSIS — R062 Wheezing: Secondary | ICD-10-CM | POA: Diagnosis not present

## 2023-02-21 DIAGNOSIS — R0981 Nasal congestion: Secondary | ICD-10-CM | POA: Diagnosis not present

## 2023-02-21 DIAGNOSIS — R051 Acute cough: Secondary | ICD-10-CM | POA: Diagnosis not present

## 2023-07-11 DIAGNOSIS — L821 Other seborrheic keratosis: Secondary | ICD-10-CM | POA: Diagnosis not present

## 2023-07-11 DIAGNOSIS — L820 Inflamed seborrheic keratosis: Secondary | ICD-10-CM | POA: Diagnosis not present

## 2023-07-11 DIAGNOSIS — D2271 Melanocytic nevi of right lower limb, including hip: Secondary | ICD-10-CM | POA: Diagnosis not present

## 2023-07-11 DIAGNOSIS — L570 Actinic keratosis: Secondary | ICD-10-CM | POA: Diagnosis not present

## 2023-07-11 DIAGNOSIS — D1801 Hemangioma of skin and subcutaneous tissue: Secondary | ICD-10-CM | POA: Diagnosis not present

## 2023-07-11 DIAGNOSIS — D2272 Melanocytic nevi of left lower limb, including hip: Secondary | ICD-10-CM | POA: Diagnosis not present

## 2023-08-05 DIAGNOSIS — M25562 Pain in left knee: Secondary | ICD-10-CM | POA: Diagnosis not present

## 2023-08-12 DIAGNOSIS — H2512 Age-related nuclear cataract, left eye: Secondary | ICD-10-CM | POA: Diagnosis not present

## 2023-08-12 DIAGNOSIS — H10413 Chronic giant papillary conjunctivitis, bilateral: Secondary | ICD-10-CM | POA: Diagnosis not present

## 2023-08-12 DIAGNOSIS — Z961 Presence of intraocular lens: Secondary | ICD-10-CM | POA: Diagnosis not present

## 2023-08-12 DIAGNOSIS — H33311 Horseshoe tear of retina without detachment, right eye: Secondary | ICD-10-CM | POA: Diagnosis not present

## 2023-08-12 DIAGNOSIS — H04123 Dry eye syndrome of bilateral lacrimal glands: Secondary | ICD-10-CM | POA: Diagnosis not present

## 2023-08-12 DIAGNOSIS — H43813 Vitreous degeneration, bilateral: Secondary | ICD-10-CM | POA: Diagnosis not present

## 2023-08-12 DIAGNOSIS — H0102B Squamous blepharitis left eye, upper and lower eyelids: Secondary | ICD-10-CM | POA: Diagnosis not present

## 2023-08-12 DIAGNOSIS — H0102A Squamous blepharitis right eye, upper and lower eyelids: Secondary | ICD-10-CM | POA: Diagnosis not present

## 2023-08-12 DIAGNOSIS — H40013 Open angle with borderline findings, low risk, bilateral: Secondary | ICD-10-CM | POA: Diagnosis not present

## 2023-10-05 DIAGNOSIS — Z0000 Encounter for general adult medical examination without abnormal findings: Secondary | ICD-10-CM | POA: Diagnosis not present

## 2023-10-05 DIAGNOSIS — G720 Drug-induced myopathy: Secondary | ICD-10-CM | POA: Diagnosis not present

## 2023-10-05 DIAGNOSIS — N400 Enlarged prostate without lower urinary tract symptoms: Secondary | ICD-10-CM | POA: Diagnosis not present

## 2023-10-05 DIAGNOSIS — E7800 Pure hypercholesterolemia, unspecified: Secondary | ICD-10-CM | POA: Diagnosis not present

## 2023-11-16 DIAGNOSIS — N400 Enlarged prostate without lower urinary tract symptoms: Secondary | ICD-10-CM | POA: Diagnosis not present

## 2023-11-16 DIAGNOSIS — E7800 Pure hypercholesterolemia, unspecified: Secondary | ICD-10-CM | POA: Diagnosis not present
